# Patient Record
Sex: Male | Born: 1985 | Race: White | Hispanic: No | Marital: Single | Smoking: Never smoker
Health system: Southern US, Community
[De-identification: ages and names within clinical notes are randomized; demographics above are authoritative.]

## PROBLEM LIST (undated history)

## (undated) DIAGNOSIS — F419 Anxiety disorder, unspecified: Secondary | ICD-10-CM

## (undated) DIAGNOSIS — F1910 Other psychoactive substance abuse, uncomplicated: Secondary | ICD-10-CM

---

## 2017-03-21 ENCOUNTER — Encounter (HOSPITAL_COMMUNITY): Payer: Self-pay | Admitting: Emergency Medicine

## 2017-03-21 ENCOUNTER — Emergency Department (HOSPITAL_COMMUNITY): Payer: No Typology Code available for payment source

## 2017-03-21 ENCOUNTER — Emergency Department (HOSPITAL_COMMUNITY)
Admission: EM | Admit: 2017-03-21 | Discharge: 2017-03-21 | Disposition: A | Discharge status: Home / Self Care | Payer: No Typology Code available for payment source | Attending: Emergency Medicine | Admitting: Emergency Medicine

## 2017-03-21 DIAGNOSIS — F419 Anxiety disorder, unspecified: Secondary | ICD-10-CM | POA: Diagnosis present

## 2017-03-21 DIAGNOSIS — R079 Chest pain, unspecified: Secondary | ICD-10-CM

## 2017-03-21 DIAGNOSIS — R0789 Other chest pain: Secondary | ICD-10-CM | POA: Insufficient documentation

## 2017-03-21 HISTORY — DX: Other psychoactive substance abuse, uncomplicated: F19.10

## 2017-03-21 HISTORY — DX: Anxiety disorder, unspecified: F41.9

## 2017-03-21 LAB — BASIC METABOLIC PANEL
ANION GAP: 12 (ref 5–15)
BUN: 15 mg/dL (ref 6–20)
CHLORIDE: 104 mmol/L (ref 101–111)
CO2: 23 mmol/L (ref 22–32)
Calcium: 9.9 mg/dL (ref 8.9–10.3)
Creatinine, Ser: 1.09 mg/dL (ref 0.61–1.24)
GFR calc non Af Amer: >60 mL/min (ref >60)
Glucose, Bld: 108 mg/dL — ABNORMAL HIGH (ref 65–99)
Potassium: 4.1 mmol/L (ref 3.5–5.1)
Sodium: 139 mmol/L (ref 135–145)

## 2017-03-21 LAB — CBC
HCT: 45.2 % (ref 39.0–52.0)
HEMOGLOBIN: 15.7 g/dL (ref 13.0–17.0)
MCH: 35.2 pg — AB (ref 26.0–34.0)
MCHC: 34.7 g/dL (ref 30.0–36.0)
MCV: 101.3 fL — AB (ref 78.0–100.0)
Platelets: 179 10*3/uL (ref 150–400)
RBC: 4.46 MIL/uL (ref 4.22–5.81)
RDW: 12.4 % (ref 11.5–15.5)
WBC: 10.2 10*3/uL (ref 4.0–10.5)

## 2017-03-21 LAB — I-STAT TROPONIN, ED: Troponin i, poc: 0 ng/mL (ref 0.00–0.08)

## 2017-03-21 NOTE — ED Notes (Signed)
Pt at desk for second time asking how much longer before he is moved to the back. Pt informed there are still patients in front of he and we will get him back asap. Pt states hes missed one dose of detox meds and will need a dose at 9pm.

## 2017-03-21 NOTE — ED Provider Notes (Signed)
[facility]-EMERGENCY DEPT Provider Note   CSN: [id number] Arrival date & time: 03/21/17  1815     History   Chief Complaint Chief Complaint  Patient presents with  . Chest Pain    HPI Christian White is a 31 y.o. male.  The history is provided by the patient.  Chest Pain   This is a new problem. The current episode started 6 to 12 hours ago. The problem has been resolved. Associated with: Patient with chest pain while at rehab today, patient has been at rehab for ETOH and benzo use and became extremely anxious and sweaty unable to fully remember the events. Patient states he still feels anxious but no chest pain at this time.  The pain is present in the substernal region. The patient is experiencing no pain. The quality of the pain is described as brief. The pain does not radiate. Associated symptoms include diaphoresis. Pertinent negatives include no abdominal pain, no back pain, no claudication, no cough, no dizziness, no exertional chest pressure, no fever, no headaches, no hemoptysis, no malaise/fatigue, no nausea, no near-syncope, no numbness, no orthopnea, no palpitations, no shortness of breath, no sputum production, no syncope, no vomiting and no weakness. He has tried nothing for the symptoms. Risk factors include alcohol intake and male gender.  Pertinent negatives for past medical history include no seizures.    Past Medical History:  Diagnosis Date  . Anxiety   . Substance abuse     There are no active problems to display for this patient.   History reviewed. No pertinent surgical history.     Home Medications    Prior to Admission medications   Not on File    Family History History reviewed. No pertinent family history.  Social History Social History  Substance Use Topics  . Smoking status: Never Smoker  . Smokeless tobacco: Never Used  . Alcohol use Yes     Allergies   Patient has no known allergies.   Review of Systems Review of Systems    Constitutional: Positive for diaphoresis. Negative for chills, fever and malaise/fatigue.  HENT: Negative for ear pain and sore throat.   Eyes: Negative for pain and visual disturbance.  Respiratory: Negative for cough, hemoptysis, sputum production and shortness of breath.   Cardiovascular: Positive for chest pain. Negative for palpitations, orthopnea, claudication, syncope and near-syncope.  Gastrointestinal: Negative for abdominal pain, nausea and vomiting.  Genitourinary: Negative for dysuria and hematuria.  Musculoskeletal: Negative for arthralgias and back pain.  Skin: Negative for color change and rash.  Neurological: Negative for dizziness, seizures, syncope, weakness, numbness and headaches.  All other systems reviewed and are negative.    Physical Exam Updated Vital Signs  ED Triage Vitals  Enc Vitals Group     BP 03/21/17 1825 127/90     Pulse Rate 03/21/17 1825 92     Resp 03/21/17 1825 18     Temp 03/21/17 1825 98.2 F (36.8 C)     Temp Source 03/21/17 1825 Oral     SpO2 03/21/17 1825 98 %     Weight --      Height --      Head Circumference --      Peak Flow --      Pain Score 03/21/17 1815 6     Pain Loc --      Pain Edu? --      Excl. in GC? --     Physical Exam  Constitutional: He appears well-developed and well-nourished.  HENT:  Head: Normocephalic and atraumatic.  Eyes: Conjunctivae and EOM are normal. Pupils are equal, round, and reactive to light.  Neck: Normal range of motion. Neck supple. No thyromegaly present.  Cardiovascular: Normal rate, regular rhythm, normal heart sounds and intact distal pulses.   No murmur heard. Pulmonary/Chest: Effort normal and breath sounds normal. No respiratory distress.  Abdominal: Soft. There is no tenderness.  Musculoskeletal: Normal range of motion. He exhibits no edema.  Neurological: He is alert.  Skin: Skin is warm and dry. Capillary refill takes less than 2 seconds.  Psychiatric: He has a normal mood and  affect.  Nursing note and vitals reviewed.    ED Treatments / Results  Labs (all labs ordered are listed, but only abnormal results are displayed) Labs Reviewed  BASIC METABOLIC PANEL - Abnormal; Notable for the following:       Result Value   Glucose, Bld 108 (*)    All other components within normal limits  CBC - Abnormal; Notable for the following:    MCV 101.3 (*)    MCH 35.2 (*)    All other components within normal limits  I-STAT TROPOININ, ED    EKG  EKG Interpretation None       Radiology Dg Chest 2 View  Result Date: 03/21/2017 CLINICAL DATA:  Chest pain and shortness of Breath EXAM: CHEST  2 VIEW COMPARISON:  None. FINDINGS: The heart size and mediastinal contours are within normal limits. Both lungs are clear. The visualized skeletal structures are unremarkable. Bilateral nipple shadows are noted. IMPRESSION: No active cardiopulmonary disease. Electronically Signed   By: Alcide Clever M.D.   On: 03/21/2017 18:38    Procedures Procedures (including critical care time)  Medications Ordered in ED Medications - No data to display   Initial Impression / Assessment and Plan / ED Course  I have reviewed the triage vital signs and the nursing notes.  Pertinent labs & imaging results that were available during my care of the patient were reviewed by me and considered in my medical decision making (see chart for details).     Christian White is a 31 year old male with history of substance abuse and anxiety who presents to the ED with chest pain. Patient's vitals at time of arrival to the ED are unremarkable and patient is without fever. 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 Patient states that he had onset of chest pain with feeling sweaty and anxious. Patient states that he has been there for 2 days and has been anxious because he has been allowed to work out. Patient has history of panic attacks and states that today was pretty similar  to his prior. Patient is currently chest pain-free now. Patient had EKG performed that showed normal sinus rhythm with no signs of ischemia, no signs of hypertrophic cardiomyopathy, Brugada, Wolff-Parkinson-White and no prolonged QTC. Patient with no DVT or PE risk factors. Exam is overall unremarkable, no murmur, no edema. Patient evaluated with CBC, BMP, chest x-ray, troponin.  Troponin within normal limits and doubt ACS. Chest x-ray with no signs of pneumonia, pneumothorax, pleural effusion. Labs unremarkable and no signs of significant anemia or electrolyte abnormalities. Patient likely with anxiety attack and denies any withdrawal symptoms at this time. Patient discharged in good condition and told to return to the ED if symptoms worsen.  Final Clinical Impressions(s) / ED Diagnoses   Final diagnoses:  Chest pain, unspecified type  Anxiety    New Prescriptions New Prescriptions  No medications on file     Virgina NorfolkCuratolo, Jermell, DO 03/21/17 2229    Mesner, Barbara CowerJason, MD 03/21/17 2234

## 2017-03-21 NOTE — ED Triage Notes (Signed)
Per EMS: pt from [facility] c/o CP today; pt given 324mg  ASA; pt has been there for 2 days for detox from ETOH and benzos; IV L FA 20g

## 2018-12-22 IMAGING — CR DG CHEST 2V
2 series · 2 of 2 positions shown · non-contrast
Comparison: None.

CLINICAL DATA: Chest pain and shortness of Breath

EXAM:
CHEST  2 VIEW

[chest pa]
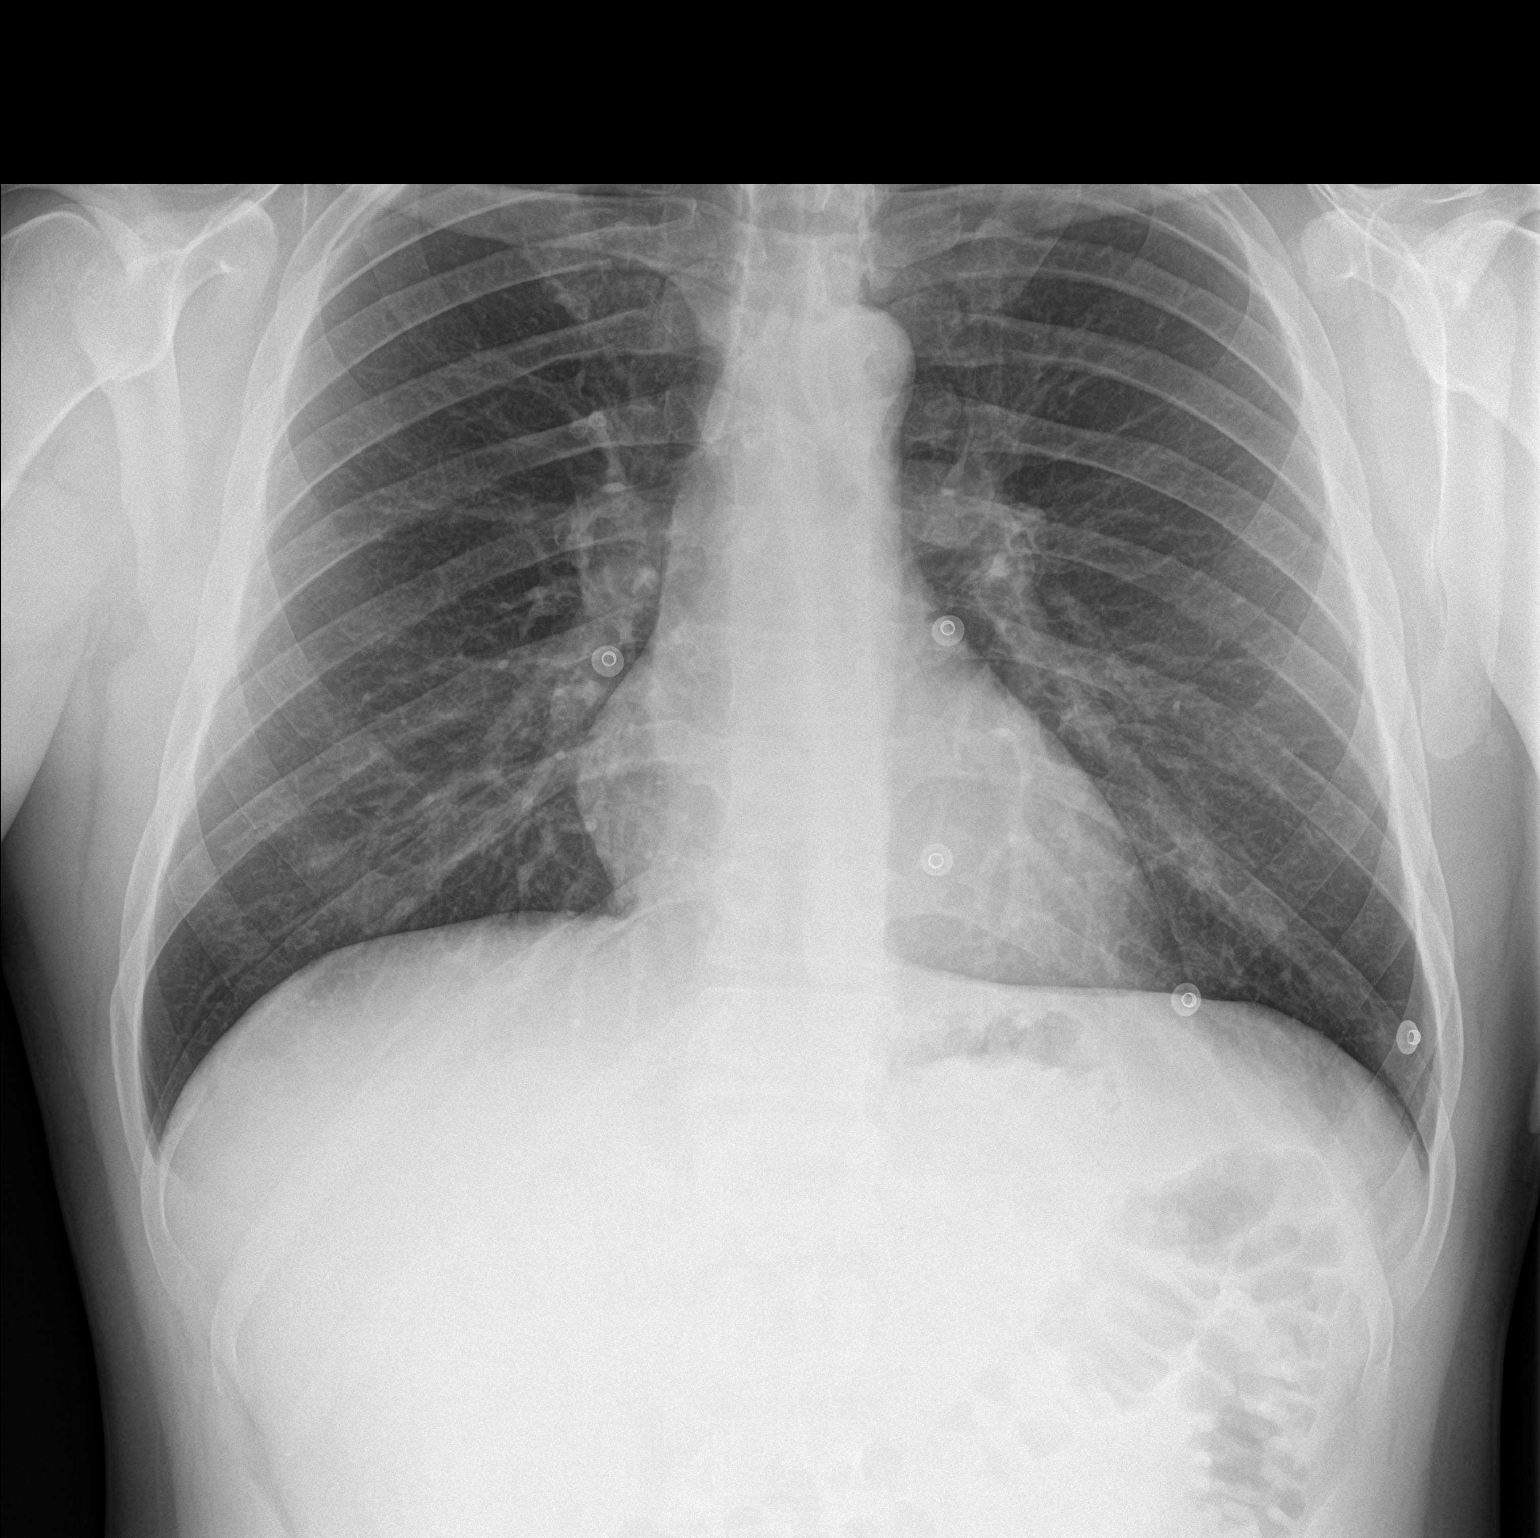

[chest lat]
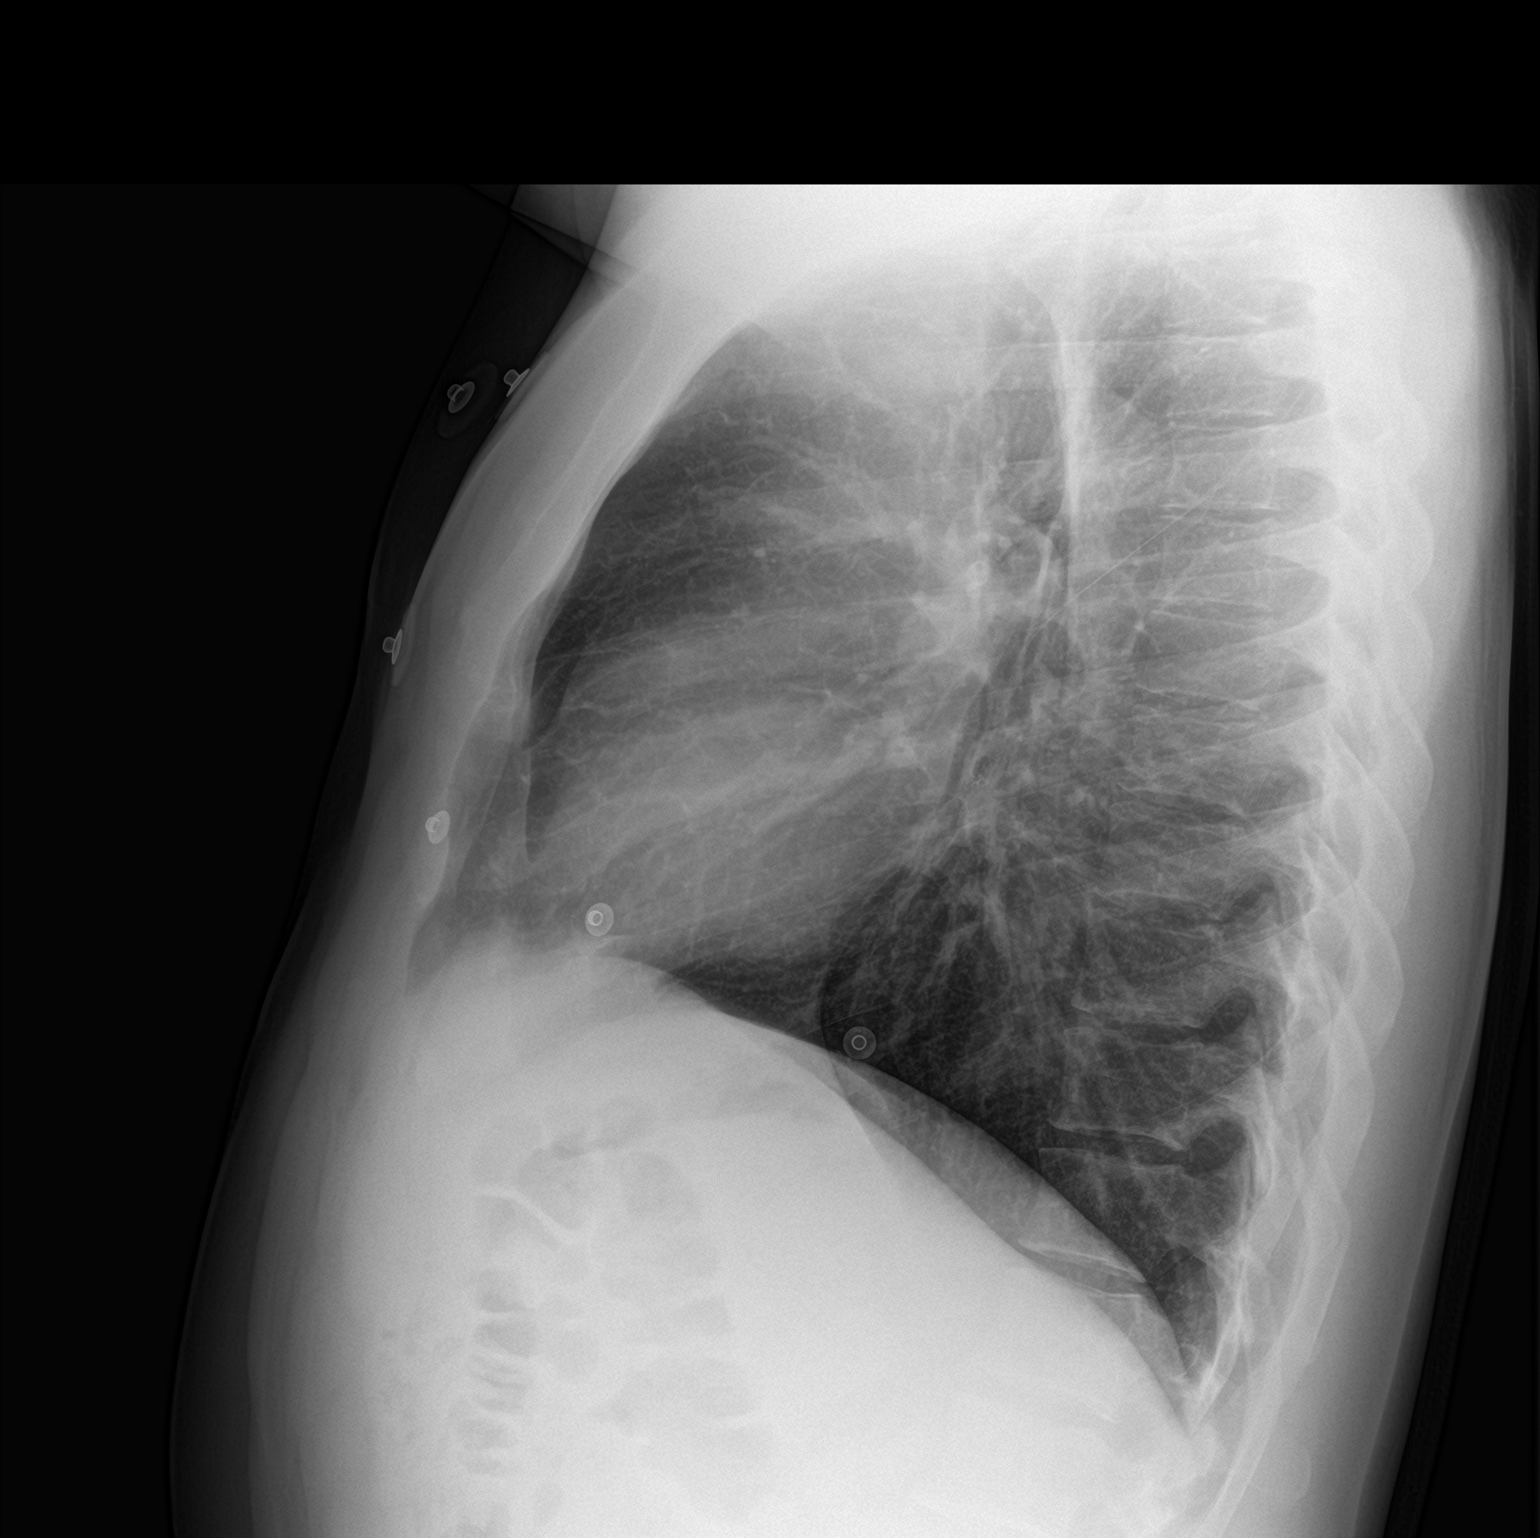

[2 of 2 positions shown; findings below may reference images not displayed]

FINDINGS: The heart size and mediastinal contours are within normal limits.
Both lungs are clear. The visualized skeletal structures are
unremarkable. Bilateral nipple shadows are noted.
IMPRESSION: No active cardiopulmonary disease.
